# Patient Record
Sex: Male | Born: 2000 | Race: Black or African American | Hispanic: No | Marital: Single | Smoking: Never smoker
Health system: Southern US, Community
[De-identification: ages and names within clinical notes are randomized; demographics above are authoritative.]

## PROBLEM LIST (undated history)

## (undated) DIAGNOSIS — S59022A Salter-Harris Type II physeal fracture of lower end of ulna, left arm, initial encounter for closed fracture: Secondary | ICD-10-CM

## (undated) DIAGNOSIS — F909 Attention-deficit hyperactivity disorder, unspecified type: Secondary | ICD-10-CM

## (undated) DIAGNOSIS — IMO0002 Reserved for concepts with insufficient information to code with codable children: Secondary | ICD-10-CM

## (undated) HISTORY — DX: Attention-deficit hyperactivity disorder, unspecified type: F90.9

## (undated) HISTORY — DX: Reserved for concepts with insufficient information to code with codable children: IMO0002

## (undated) HISTORY — DX: Salter-Harris type II physeal fracture of lower end of ulna, left arm, initial encounter for closed fracture: S59.022A

---
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
## 2022-06-18 ENCOUNTER — Other Ambulatory Visit: Payer: Self-pay

## 2022-06-18 ENCOUNTER — Emergency Department (HOSPITAL_COMMUNITY)
Admission: EM | Admit: 2022-06-18 | Discharge: 2022-06-18 | Discharge status: Against Medical Advice | Payer: Medicaid Other | Attending: Emergency Medicine | Admitting: Emergency Medicine

## 2022-06-18 ENCOUNTER — Emergency Department (HOSPITAL_COMMUNITY): Payer: Medicaid Other

## 2022-06-18 ENCOUNTER — Encounter (HOSPITAL_COMMUNITY): Payer: Self-pay

## 2022-06-18 DIAGNOSIS — W228XXA Striking against or struck by other objects, initial encounter: Secondary | ICD-10-CM | POA: Insufficient documentation

## 2022-06-18 DIAGNOSIS — Z5321 Procedure and treatment not carried out due to patient leaving prior to being seen by health care provider: Secondary | ICD-10-CM | POA: Diagnosis not present

## 2022-06-18 DIAGNOSIS — M79642 Pain in left hand: Secondary | ICD-10-CM | POA: Insufficient documentation

## 2022-06-18 DIAGNOSIS — M79641 Pain in right hand: Secondary | ICD-10-CM | POA: Diagnosis not present

## 2022-06-18 DIAGNOSIS — R2232 Localized swelling, mass and lump, left upper limb: Secondary | ICD-10-CM | POA: Diagnosis not present

## 2022-06-18 DIAGNOSIS — Y990 Civilian activity done for income or pay: Secondary | ICD-10-CM | POA: Diagnosis not present

## 2022-06-18 NOTE — ED Provider Triage Note (Signed)
Emergency Medicine Provider Triage Evaluation Note  [name] , a 21 y.o. male  was evaluated in triage.  Pt complains of left hand nodule onset several weeks.  Denies recent injury or trauma.  Denies pain to the area.  No meds tried prior to arrival.  Also notes that he wants to have his right hand checked as well because he has punched a wall while at work.  Denies pain to the area.  Denies color change or wound.  Review of Systems  Positive: As per HPI Negative:   Physical Exam  BP 100/84 (BP Location: Right Arm)   Pulse 70   Temp 98.1 F (36.7 C) (Oral)   Resp 18   Ht 5\' 6"  (1.676 m)   Wt 65.3 kg   SpO2 98%   BMI 23.24 kg/m  Gen:   Awake, no distress   Resp:  Normal effort  MSK:   Moves extremities without difficulty  Other:  Nodule noted to left fourth and fifth metacarpal that is mobile without tenderness to palpation or overlying skin changes.  No tenderness to palpation noted to bilateral hands.  Radial pulse intact bilaterally.  Grip strength 5/5 bilaterally  Medical Decision Making  Medically screening exam initiated at 5:36 PM.  Appropriate orders placed.  Harry Shuck was informed that the remainder of the evaluation will be completed by another provider, this initial triage assessment does not replace that evaluation, and the importance of remaining in the ED until their evaluation is complete.  Work-up initiated   Maisa Bedingfield A, PA-C 06/18/22 1836

## 2022-06-18 NOTE — ED Notes (Signed)
Pt called multiple times without response 

## 2022-06-18 NOTE — ED Triage Notes (Signed)
Patient has knot to left hand that has been there for awhile but is causing pain.
